# Patient Record
Sex: Female | Born: 1960 | Race: Black or African American | Hispanic: No | Marital: Married | Smoking: Never smoker
Health system: Southern US, Community
[De-identification: ages and names within clinical notes are randomized; demographics above are authoritative.]

## PROBLEM LIST (undated history)

## (undated) DIAGNOSIS — F319 Bipolar disorder, unspecified: Secondary | ICD-10-CM

## (undated) DIAGNOSIS — Z8719 Personal history of other diseases of the digestive system: Secondary | ICD-10-CM

## (undated) DIAGNOSIS — F32A Depression, unspecified: Secondary | ICD-10-CM

## (undated) DIAGNOSIS — F419 Anxiety disorder, unspecified: Secondary | ICD-10-CM

## (undated) DIAGNOSIS — D649 Anemia, unspecified: Secondary | ICD-10-CM

## (undated) DIAGNOSIS — R51 Headache: Secondary | ICD-10-CM

## (undated) DIAGNOSIS — K219 Gastro-esophageal reflux disease without esophagitis: Secondary | ICD-10-CM

## (undated) DIAGNOSIS — F329 Major depressive disorder, single episode, unspecified: Secondary | ICD-10-CM

## (undated) DIAGNOSIS — I10 Essential (primary) hypertension: Secondary | ICD-10-CM

## (undated) DIAGNOSIS — Z8619 Personal history of other infectious and parasitic diseases: Secondary | ICD-10-CM

## (undated) DIAGNOSIS — Z8661 Personal history of infections of the central nervous system: Secondary | ICD-10-CM

## (undated) HISTORY — DX: Anxiety disorder, unspecified: F41.9

## (undated) HISTORY — DX: Gastro-esophageal reflux disease without esophagitis: K21.9

## (undated) HISTORY — PX: PATELLA FRACTURE SURGERY: SHX735

## (undated) HISTORY — DX: Essential (primary) hypertension: I10

## (undated) HISTORY — DX: Depression, unspecified: F32.A

## (undated) HISTORY — DX: Anemia, unspecified: D64.9

## (undated) HISTORY — DX: Personal history of infections of the central nervous system: Z86.61

## (undated) HISTORY — DX: Bipolar disorder, unspecified: F31.9

## (undated) HISTORY — DX: Major depressive disorder, single episode, unspecified: F32.9

## (undated) HISTORY — DX: Headache: R51

## (undated) HISTORY — DX: Personal history of other infectious and parasitic diseases: Z86.19

## (undated) HISTORY — DX: Personal history of other diseases of the digestive system: Z87.19

---

## 2010-04-19 ENCOUNTER — Emergency Department (HOSPITAL_COMMUNITY)
Admission: EM | Admit: 2010-04-19 | Discharge: 2010-04-19 | Disposition: A | Discharge status: Home / Self Care | Payer: Medicaid Other | Attending: Emergency Medicine | Admitting: Emergency Medicine

## 2010-04-19 DIAGNOSIS — I10 Essential (primary) hypertension: Secondary | ICD-10-CM | POA: Insufficient documentation

## 2010-04-19 DIAGNOSIS — K3184 Gastroparesis: Secondary | ICD-10-CM | POA: Insufficient documentation

## 2010-04-19 DIAGNOSIS — R1110 Vomiting, unspecified: Secondary | ICD-10-CM | POA: Insufficient documentation

## 2010-04-19 DIAGNOSIS — E119 Type 2 diabetes mellitus without complications: Secondary | ICD-10-CM | POA: Insufficient documentation

## 2010-04-19 DIAGNOSIS — Z79899 Other long term (current) drug therapy: Secondary | ICD-10-CM | POA: Insufficient documentation

## 2010-04-19 DIAGNOSIS — M62838 Other muscle spasm: Secondary | ICD-10-CM | POA: Insufficient documentation

## 2010-04-19 LAB — POCT I-STAT, CHEM 8
Glucose, Bld: 250 mg/dL — ABNORMAL HIGH (ref 70–99)
HCT: 44 % (ref 36.0–46.0)
Hemoglobin: 15 g/dL (ref 12.0–15.0)
Potassium: 4.1 mEq/L (ref 3.5–5.1)
Sodium: 139 mEq/L (ref 135–145)

## 2010-08-10 ENCOUNTER — Encounter: Payer: Self-pay | Admitting: Internal Medicine

## 2010-08-14 ENCOUNTER — Encounter: Payer: Self-pay | Admitting: Internal Medicine

## 2010-08-14 ENCOUNTER — Ambulatory Visit (INDEPENDENT_AMBULATORY_CARE_PROVIDER_SITE_OTHER): Payer: Medicaid Other | Admitting: Internal Medicine

## 2010-08-14 DIAGNOSIS — R1110 Vomiting, unspecified: Secondary | ICD-10-CM

## 2010-08-14 DIAGNOSIS — I10 Essential (primary) hypertension: Secondary | ICD-10-CM | POA: Insufficient documentation

## 2010-08-14 DIAGNOSIS — N189 Chronic kidney disease, unspecified: Secondary | ICD-10-CM | POA: Insufficient documentation

## 2010-08-14 DIAGNOSIS — F32A Depression, unspecified: Secondary | ICD-10-CM

## 2010-08-14 DIAGNOSIS — F341 Dysthymic disorder: Secondary | ICD-10-CM

## 2010-08-14 DIAGNOSIS — K219 Gastro-esophageal reflux disease without esophagitis: Secondary | ICD-10-CM

## 2010-08-14 DIAGNOSIS — E119 Type 2 diabetes mellitus without complications: Secondary | ICD-10-CM

## 2010-08-14 DIAGNOSIS — F419 Anxiety disorder, unspecified: Secondary | ICD-10-CM | POA: Insufficient documentation

## 2010-08-14 DIAGNOSIS — F329 Major depressive disorder, single episode, unspecified: Secondary | ICD-10-CM

## 2010-08-14 MED ORDER — PANTOPRAZOLE SODIUM 40 MG PO TBEC: 40.0000 mg | DELAYED_RELEASE_TABLET | Freq: Every day | ORAL | Status: DC

## 2010-08-14 NOTE — Progress Notes (Signed)
HPI: This is a  50 yo female with hx of DM2, anxiety/depression, HTN, and CRI who presents today with her husband for evaluation of vomiting.  Pt states this has been an ongoing problem for her for about 6-8 months.  She reports seemed to start around the time she was dx with DM2.  She reports indigestion on most days and for her this means bloating and a "heavy" feeling in her stomach.  She reports being told "my pancreas goes to sleep". Aforementioned symptoms are worse after eating, particularly after large meals.  Some foods make this worse, esp meats and fatty foods.  She has tried some diet modification which helps some.  She reports intermittent nausea and vomiting.  She estimates vomiting 1-2 times per weeks, though there have been periods when she vomits daily.  She reports seeing scant amt of red blood in her emesis about 1 week ago.  No melena.  She reports at times vomiting undigested foods and even foods she ate 1-2 days before.  + Heartburn on 2-3 days per week, and she is not taking anything for this at present. No dysphagia or odynophagia.  +Noctural cough and "mucus in throat".  No trouble with belching.  No specific abd pain at present or recently.  Stools are normal, without blood or melena.     Of note she was taking Byetta and this seemed to worsen her chest heaviness, abd bloating and nausea/vomiting.  Since stopping her symptoms are better, but not resolved.  HA associated with Byetta have stopped.  She was given an rx for reglan after an ED visit in April 2012 for n/v, but she no longer uses this med.  She feels it may have helped.   Review of systems: Gen: no f/c/ns, no wt loss, appetite is good HEENT: no HA, change in vision, see hpi CV: no cp, dyspnea, edema Resp: No cough, wheezing, trouble breathing Abd: see hpi GU: no dysuria, hematuria, +nocturia Msk: no joint pain, swelling Neuro: no weakness, numbness, tingling Psych: +anxiety intermittently, no depressed mood Endo:  no hold/cold intol   Past Medical History  Diagnosis Date  . Anemia   . Anxiety and depression   . Headache   . Diabetes mellitus   . History of gallstones   . GERD (gastroesophageal reflux disease)   . History of hepatitis A   . History of meningitis   . HTN (hypertension)   . Bipolar disorder     Past Surgical History  Procedure Date  . Cesarean section     x 2  . Patella fracture surgery    SH:  reports that she has never smoked. She has never used smokeless tobacco. She reports that she does not drink alcohol or use illicit drugs. Of note she did previously drink etoh heavily, but stopped >15 yrs ago.  Family History: Diabetes in her sister; Heart disease in her paternal uncle; Hypertension in her mother; Insomnia in her sister; Cirrhosis -father secondary to ETOH; Pancreatic cancer in her maternal grandfather; and Sleep apnea in her sister.   Current Medications, Allergies were all reviewed with the patient via [facility] electronic medical record system.    Physical Exam: BP 102/74  Pulse 92  Ht 5' 0.03" (1.525 m)  Wt 181 lb (82.101 kg)  BMI 35.31 kg/m2 Constitutional: generally well-appearing Psychiatric: alert and oriented x3 Eyes: extraocular movements intact Mouth: oral pharynx moist, no lesions Neck: supple no lymphadenopathy Cardiovascular: heart regular rate and rhythm, no mrg Lungs:  clear to auscultation bilaterally Abdomen: soft, nontender, nondistended, no obvious ascites, no peritoneal signs, normal bowel sounds, vertical midline scar inf to umbilicus (well-healed) Extremities: no lower extremity edema bilaterally Skin: no lesions on visible extremities  Prior LABS:  BMET    Component Value Date/Time   NA 139 04/19/2010 1727   K 4.1 04/19/2010 1727   CL 105 04/19/2010 1727   GLUCOSE 250* 04/19/2010 1727   BUN 20 04/19/2010 1727   CREATININE 1.3* 04/19/2010 1727   CBC    Component Value Date/Time   HGB 15.0 04/19/2010 1727   HCT 44.0  04/19/2010 1727   Colonoscopy - pt reports [place] colon[place] in [place] in 2010 - "normal"  Assessment and plan: 50 y.o. female with DM2, HTN, anxiety/depression, GERD presenting with n/v, abdominal bloating and intermittent heartburn.  1. N/V - working dx at present is gastroparesis in the setting of poorly controlled DM2 (I do not have an A1C, but records are requested.  Pt reports freq accucheck readings ranging 200-300).  Will perform EGD to exclude other possible causes including PUD, gastritis.  If this is negative, then would recommend gastric emptying study to confirm diagnosis.  We have discussed at length this possible diagnosis and how tight glucose control and dietary modification are key to improving symptoms.  We also have discussed possible medical treatments, including metoclopramide, and also discussed the potential adverse (even irreversible) reactions associated with this med.  I would like to complete EGD and confirm the dx before discussing potential meds.  She is given a gastroparesis diet today.  2. Heartburn - symptoms are freq without alarm symptoms. Will start pantoprazole 40 mg daily.  She is instructed on how best to take this medication (31m-1h before 1st meal of the day).  Also we reviewed GERD hygiene.   --return for EGD --if neg consider gastric emptying study --get report of prior colonoscopy --gastroparesis diet --GERD hygiene

## 2010-08-14 NOTE — Patient Instructions (Addendum)
You have been scheduled for a Endoscopy on 08/28/10. Your protonix prescription has been sent to your pharmacy.   Diabetes and Gastroparesis  Gastroparesis is also called slowed stomach emptying (delayed gastric emptying). It is a condition in which the stomach takes too long to empty its contents. It often happens in people with diabetes.   CAUSES Gastroparesis happens when nerves to the stomach are damaged or stop working. When the nerves are damaged, the muscles of the stomach and intestines do not work normally. Then, the movement of food is slowed or stopped. High blood glucose (sugar) causes changes in nerves and can damage the blood vessels that carry oxygen and nutrients to the nerves. RISK FACTORS  Diabetes.   Post-viral syndromes.   Eating disorders (anorexia or bulimia).   Surgery on the stomach or vagus nerve.   Gastroesophageal reflux disease (rarely).   Smooth muscle disorders such as amyloidosis and scleroderma.   Metabolic disorders, including hypothyroidism.   Parkinson's disease.  SYMPTOMS  Heartburn.   Feeling sick to your stomach (nausea).   Vomiting of undigested food.   An early feeling of fullness when eating.   Weight loss.   Belly (abdominal) bloating.   Erratic blood glucose levels.   Lack of appetite.   Gastroesophageal reflux.   Spasms of the stomach wall.  COMPLICATIONS  Bacterial overgrowth in stomach. Food stays in the stomach and can ferment and cause germs (bacteria) to grow.   Weight loss. You have difficulty digesting and absorbing nutrients.   Vomiting.   Obstruction in the stomach. Undigested food can harden and cause nausea and vomiting.   Blood glucose fluctuations caused by inconsistent food absorption.  DIAGNOSIS The diagnosis of gastroparesis is confirmed through one or more of the following tests:  Barium X-rays and scans. These tests look at how long it takes for food to move through the stomach.   Gastric  manometry. This test measures electrical and muscular activity in the stomach. A thin tube is passed down the throat into the stomach. The tube contains a wire that takes measurements of the stomach's electrical and muscular activity as it digests liquids and solid food.   An endoscopy is a procedure done with a long, thin tube called an endoscope. It is passed through the mouth and gently guides it down the esophagus into the stomach. This tubes helps the caregiver look at the lining of the stomach to check for any abnormalities.   Ultrasound. This can rule out gallbladder disease or pancreatitis. This test will outline and define the shape of the gallbladder and pancreas.  TREATMENT  The primary treatment is to identify the problem (i.e. diabetes) and to help control blood glucose levels. Treatments include:   Exercise.   Medications to control nausea and vomiting.   Medications to stimulate stomach muscles.   Changes in what and when you eat.   Having smaller meals more often.   Eating low fiber forms of high fiber foods (i.e. cooked vegetables instead of raw).   Eating low fat foods.   Liquids are easier to digest.   In severe cases, feeding tubes and intravenous feeding may be needed.   It is important to note that in most cases, treatment does not cure gastroparesis. It is usually a lasting (chronic) condition. Treatment helps you manage the condition so that you can be as healthy and comfortable as possible.  NEW TREATMENTS  A gastric neurostimulator has been developed to assist people with gastroparesis. The battery-operated device  is surgically implanted. It emits mild electrical pulses to help improve stomach emptying and to control nausea and vomiting.   The use of botulinum toxin has been shown to improve stomach emptying by decreasing the prolonged contractions of the muscle between the stomach and the small intestine (pyloric sphincter). The benefits are temporary.    SEEK MEDICAL CARE IF:  You are having problems keeping your blood glucose in goal range.   You are having nausea, vomiting, bloating or feelings of fullness with eating.   Your symptoms do not change with a change in diet.  Document Released: 12/18/2004 Document Re-Released: 10/15/2008 [employer] Patient Information 2011 [place], [place].

## 2010-08-14 NOTE — Progress Notes (Deleted)
  Subjective:    Patient ID: Kelly Alvarez, female    DOB: [birth year], 50 y.o.   MRN: [id number]  HPI    Review of Systems     Objective:   Physical Exam        Assessment & Plan:

## 2010-08-24 ENCOUNTER — Other Ambulatory Visit: Payer: Self-pay | Admitting: Nephrology

## 2010-08-25 ENCOUNTER — Telehealth: Payer: Self-pay | Admitting: Internal Medicine

## 2010-08-25 NOTE — Telephone Encounter (Signed)
Staff message to myself to call

## 2010-08-25 NOTE — Telephone Encounter (Signed)
Okay. If patient has not called back within 1 month, please call her for reminder If patient has clinic appt, this also needs to be held until after procedure. Thanks.

## 2010-08-28 ENCOUNTER — Other Ambulatory Visit: Payer: Medicaid Other | Admitting: Internal Medicine

## 2010-08-30 ENCOUNTER — Other Ambulatory Visit: Payer: Medicaid Other

## 2010-09-06 ENCOUNTER — Other Ambulatory Visit: Payer: Medicaid Other

## 2010-09-12 ENCOUNTER — Other Ambulatory Visit: Payer: Self-pay | Admitting: Family Medicine

## 2010-09-12 ENCOUNTER — Ambulatory Visit
Admission: RE | Admit: 2010-09-12 | Discharge: 2010-09-12 | Disposition: A | Discharge status: Home / Self Care | Payer: Medicaid Other | Source: Ambulatory Visit | Attending: Nephrology | Admitting: Nephrology

## 2010-09-12 DIAGNOSIS — Z1231 Encounter for screening mammogram for malignant neoplasm of breast: Secondary | ICD-10-CM

## 2010-09-21 ENCOUNTER — Ambulatory Visit: Payer: Medicaid Other

## 2010-09-26 ENCOUNTER — Telehealth: Payer: Self-pay

## 2010-09-26 NOTE — Telephone Encounter (Signed)
Message copied by Donata Duff on Tue Sep 26, 2010  3:41 PM ------      Message from: Donata Duff      Created: Fri Aug 25, 2010 10:41 AM       Call pt to see if procedure has been scheduled

## 2010-09-26 NOTE — Telephone Encounter (Signed)
Dr Rhea Belton I called the pt today and she still does not want to schedule she says she will call when she is ready

## 2010-10-03 ENCOUNTER — Ambulatory Visit
Admission: RE | Admit: 2010-10-03 | Discharge: 2010-10-03 | Disposition: A | Discharge status: Home / Self Care | Payer: Medicaid Other | Source: Ambulatory Visit | Attending: Family Medicine | Admitting: Family Medicine

## 2010-10-03 DIAGNOSIS — Z1231 Encounter for screening mammogram for malignant neoplasm of breast: Secondary | ICD-10-CM

## 2010-11-06 ENCOUNTER — Emergency Department (HOSPITAL_COMMUNITY)
Admission: EM | Admit: 2010-11-06 | Discharge: 2010-11-06 | Discharge status: Against Medical Advice | Payer: Medicaid Other | Attending: Emergency Medicine | Admitting: Emergency Medicine

## 2010-11-06 ENCOUNTER — Other Ambulatory Visit: Payer: Self-pay

## 2010-11-06 ENCOUNTER — Encounter (HOSPITAL_COMMUNITY): Payer: Self-pay | Admitting: Emergency Medicine

## 2010-11-06 DIAGNOSIS — R079 Chest pain, unspecified: Secondary | ICD-10-CM | POA: Insufficient documentation

## 2010-11-06 NOTE — ED Notes (Signed)
Pt c/o chest heaviness and cough with gray sputum x several days; pt sts pain is heaviness worse when laying down with cough; pt denies fever

## 2012-12-04 IMAGING — US US RENAL
1 series · 14 of 25 positions shown · non-contrast
Comparison: None.

CLINICAL DATA: Chronic kidney disease, proteinuria

RENAL/URINARY TRACT ULTRASOUND COMPLETE

[Series 1: us renal · 0.20mm/px · 14 of 29 slices shown]
[im 1/29]
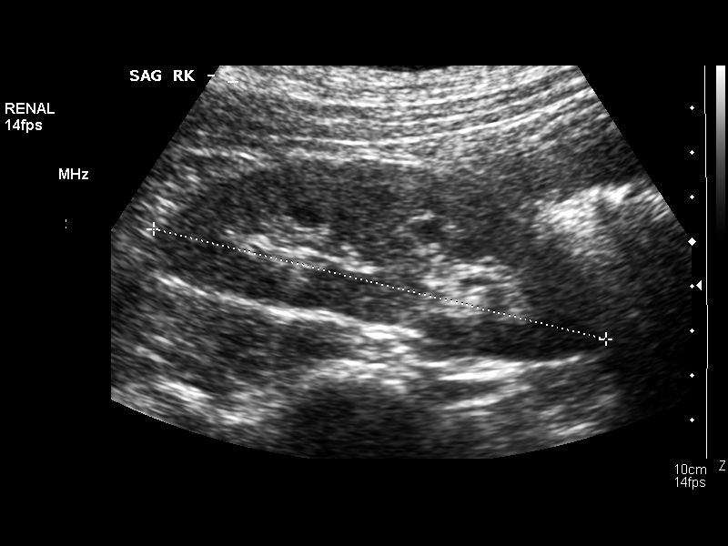
[im 3/29]
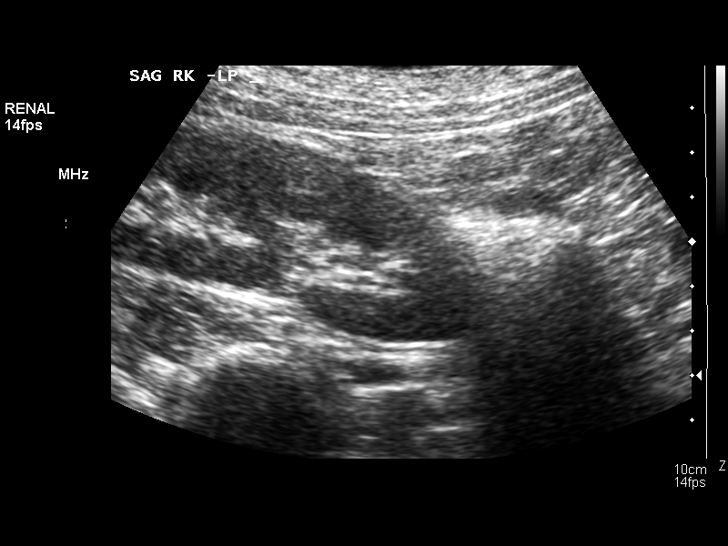
[im 5/29]
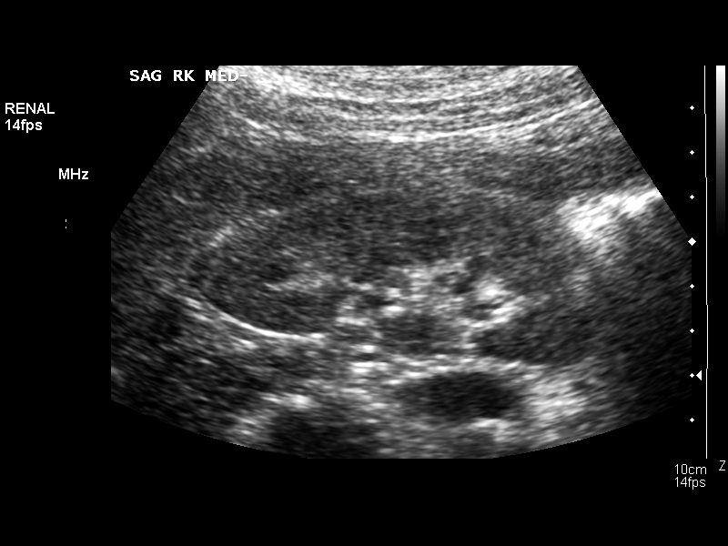
[im 8/29]
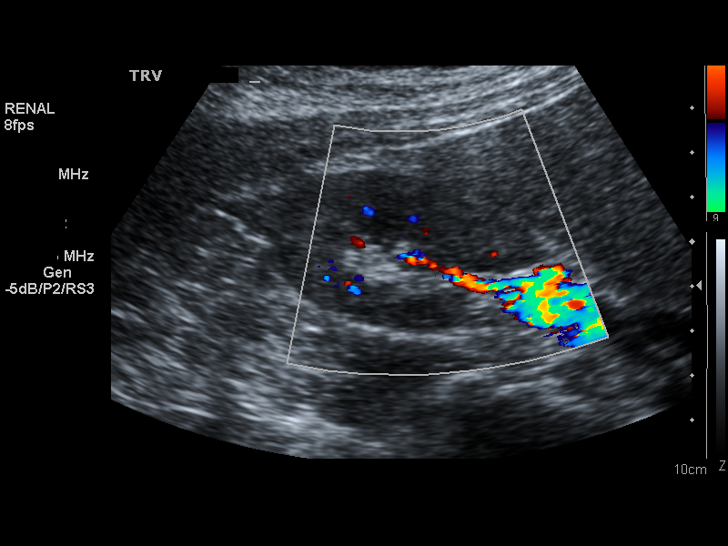
[im 10/29]
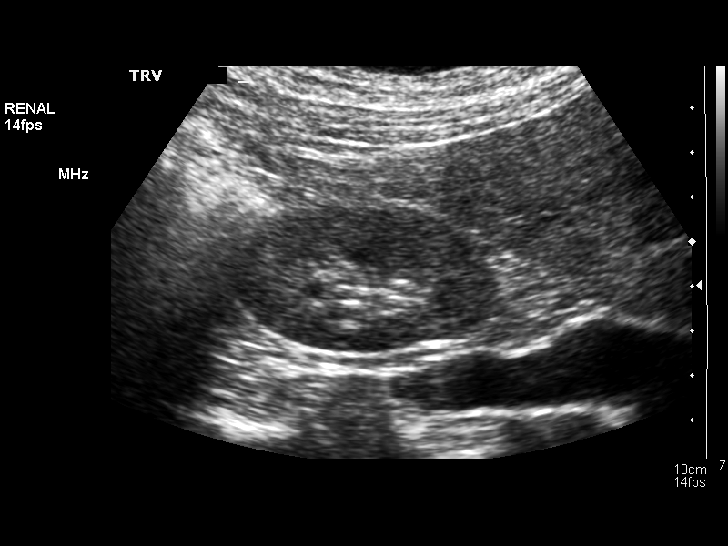
[im 11/29]
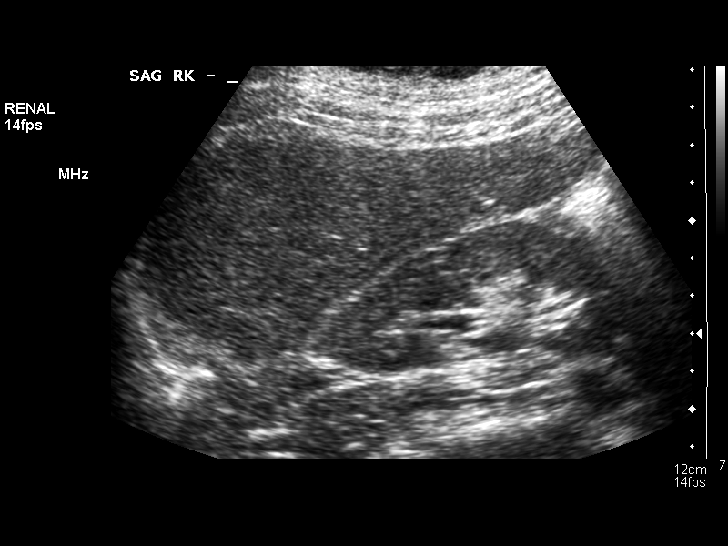
[im 13/29]
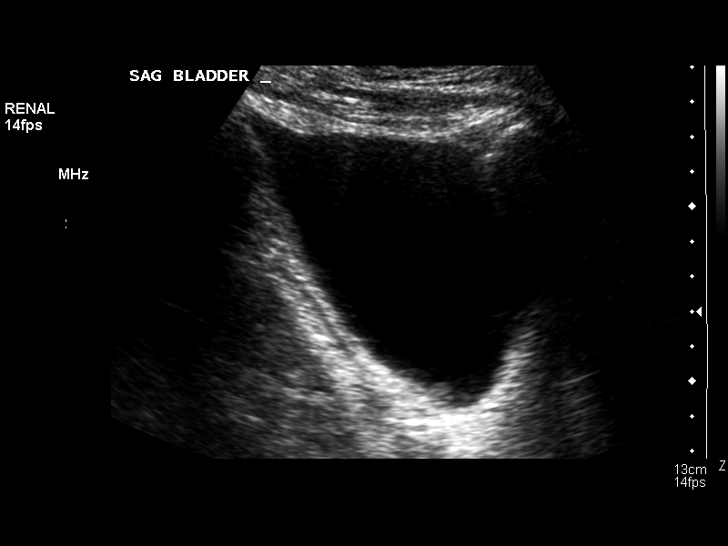
[im 16/29]
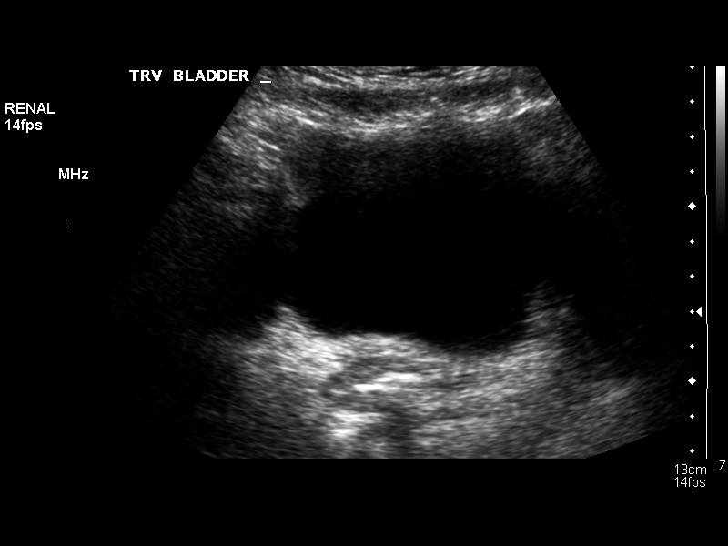
[im 18/29]
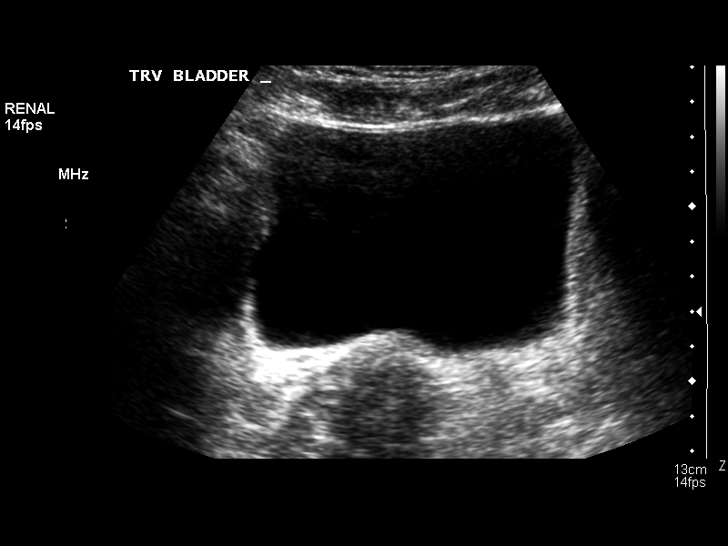
[im 19/29]
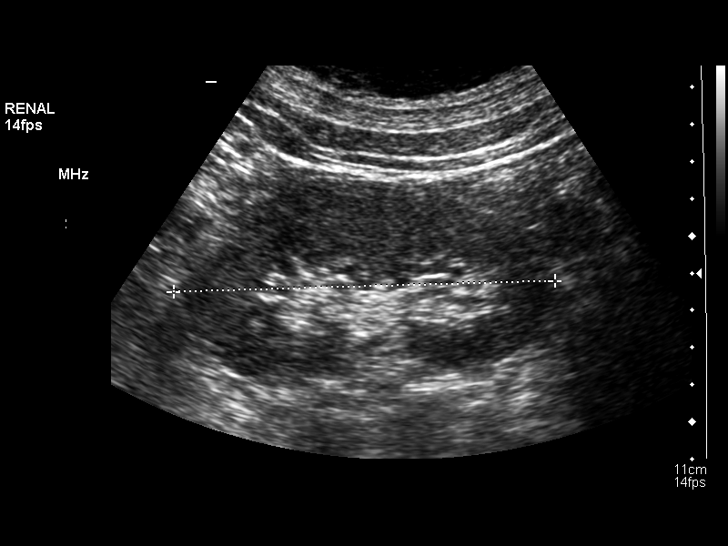
[im 22/29]
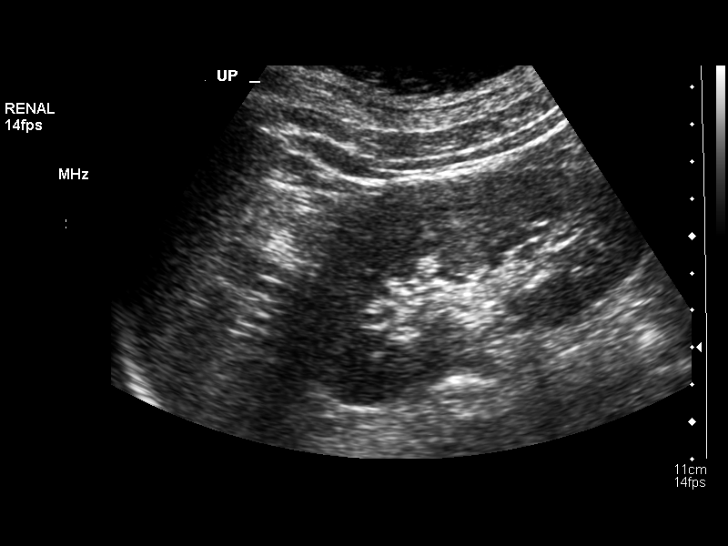
[im 24/29]
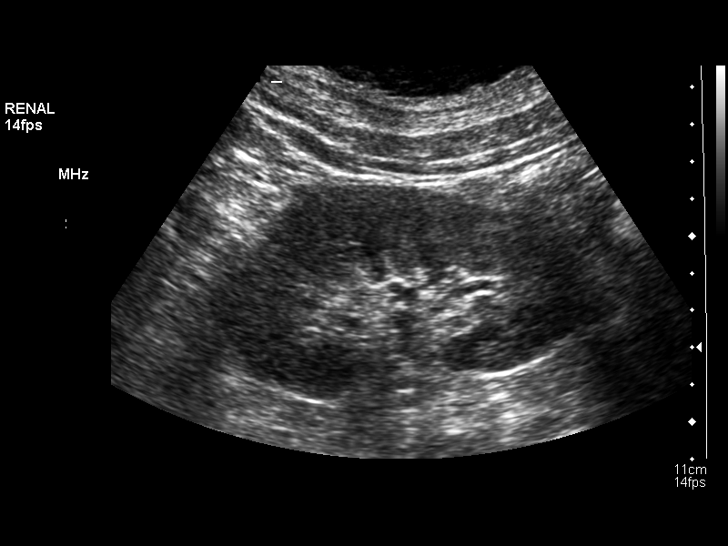
[im 26/29]
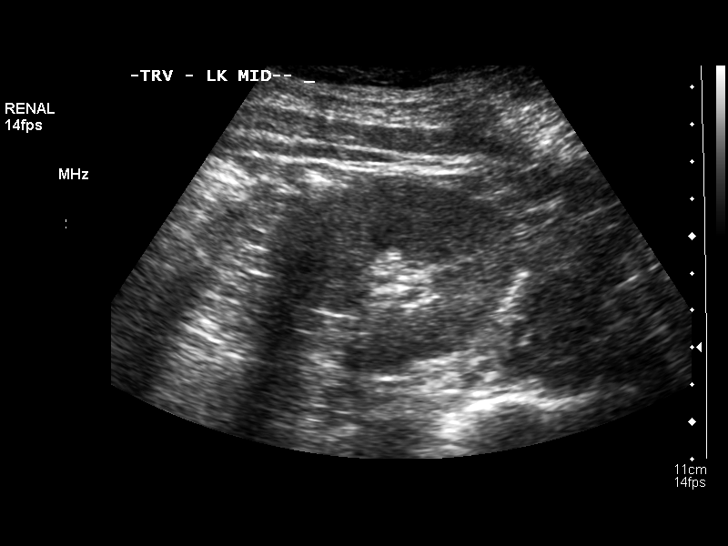
[im 29/29]
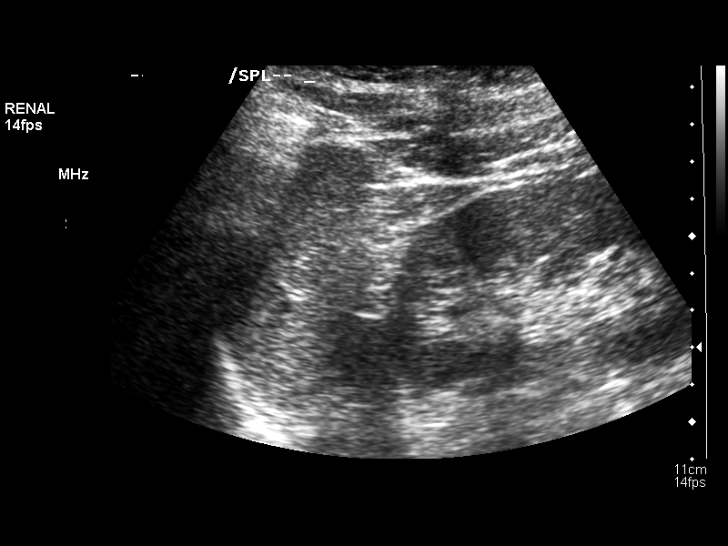

[14 of 25 positions shown; findings below may reference images not displayed]

FINDINGS: Right Kidney:  No hydronephrosis is seen.  The right kidney
measures 10.4 cm sagittally.

Left Kidney:  No hydronephrosis.  The left kidney measures 10.3 cm.

Bladder:  The urinary bladder is unremarkable.
IMPRESSION: No hydronephrosis.

## 2018-11-14 ENCOUNTER — Other Ambulatory Visit (HOSPITAL_COMMUNITY): Payer: Self-pay | Admitting: Family Medicine

## 2018-11-14 DIAGNOSIS — R9431 Abnormal electrocardiogram [ECG] [EKG]: Secondary | ICD-10-CM

## 2018-11-21 ENCOUNTER — Encounter (HOSPITAL_COMMUNITY): Payer: Self-pay | Admitting: Family Medicine

## 2018-12-01 ENCOUNTER — Telehealth (HOSPITAL_COMMUNITY): Payer: Self-pay

## 2018-12-01 NOTE — Telephone Encounter (Signed)
New  Message    Just an FYI. We have made several attempts to contact this patient including sending a letter to schedule or reschedule their echocardiogram. We will be removing the patient from the echo WQ.   11.30.20 @ 2:03pm phone disconnected  - Eriyanna Kofoed  11.20.20 @ 3:46pm unable to leave a message - mail reminder letter Nashla Althoff  11/14/18 at 75:44 am....no pre-cert per [facility]arc[facility] medical
